# Patient Record
Sex: Female | Born: 1995 | Race: Black or African American | Hispanic: No | Marital: Single | Smoking: Never smoker
Health system: Southern US, Community
[De-identification: ages and names within clinical notes are randomized; demographics above are authoritative.]

## PROBLEM LIST (undated history)

## (undated) HISTORY — PX: MYRINGOPLASTY: SUR873

## (undated) HISTORY — PX: MYRINGOTOMY: SUR874

---
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
## 2017-02-02 ENCOUNTER — Encounter: Payer: Self-pay | Admitting: General Practice

## 2017-02-02 ENCOUNTER — Ambulatory Visit (INDEPENDENT_AMBULATORY_CARE_PROVIDER_SITE_OTHER): Payer: Medicaid Other | Admitting: *Deleted

## 2017-02-02 DIAGNOSIS — Z3042 Encounter for surveillance of injectable contraceptive: Secondary | ICD-10-CM | POA: Diagnosis present

## 2017-02-02 MED ORDER — MEDROXYPROGESTERONE ACETATE 150 MG/ML IM SUSP
150.0000 mg | Freq: Once | INTRAMUSCULAR | Status: AC
  Administered 2017-02-02: 150 mg via INTRAMUSCULAR

## 2017-04-27 ENCOUNTER — Ambulatory Visit (INDEPENDENT_AMBULATORY_CARE_PROVIDER_SITE_OTHER): Payer: Medicaid Other | Admitting: *Deleted

## 2017-04-27 VITALS — BP 99/58 | HR 60

## 2017-04-27 DIAGNOSIS — Z3042 Encounter for surveillance of injectable contraceptive: Secondary | ICD-10-CM | POA: Diagnosis not present

## 2017-04-27 MED ORDER — MEDROXYPROGESTERONE ACETATE 150 MG/ML IM SUSP
150.0000 mg | Freq: Once | INTRAMUSCULAR | Status: AC
  Administered 2017-04-27: 150 mg via INTRAMUSCULAR

## 2017-04-27 NOTE — Progress Notes (Signed)
Patient presents to clinic for depo provera injection. Tolerated well.

## 2017-07-12 ENCOUNTER — Telehealth: Payer: Self-pay | Admitting: General Practice

## 2017-07-12 NOTE — Telephone Encounter (Signed)
Patient called and left message stating she has a question and would like a callback. Called patient, no answer- left message stating we are trying to reach you to return your phone call, please call us back or send us a mychart message

## 2017-07-13 ENCOUNTER — Ambulatory Visit: Payer: Medicaid Other

## 2017-07-13 NOTE — Telephone Encounter (Signed)
Patient called and left another message stating she would like a call back as she has questions about medicaid

## 2017-07-17 ENCOUNTER — Telehealth: Payer: Self-pay | Admitting: *Deleted

## 2017-07-17 ENCOUNTER — Encounter: Payer: Self-pay | Admitting: Obstetrics and Gynecology

## 2017-07-17 NOTE — Telephone Encounter (Signed)
Patient has a question for a nurse. °

## 2017-07-26 NOTE — Telephone Encounter (Signed)
See previous note

## 2017-07-26 NOTE — Telephone Encounter (Signed)
Attempted to contact patient again via phone.  No answer.  Left message to return our call if she still has questions.

## 2017-07-31 ENCOUNTER — Ambulatory Visit (INDEPENDENT_AMBULATORY_CARE_PROVIDER_SITE_OTHER): Payer: Medicaid Other

## 2017-07-31 VITALS — BP 114/62 | HR 68

## 2017-07-31 DIAGNOSIS — Z3042 Encounter for surveillance of injectable contraceptive: Secondary | ICD-10-CM | POA: Diagnosis not present

## 2017-07-31 DIAGNOSIS — Z3049 Encounter for surveillance of other contraceptives: Secondary | ICD-10-CM | POA: Diagnosis not present

## 2017-07-31 LAB — POCT PREGNANCY, URINE: Preg Test, Ur: NEGATIVE

## 2017-07-31 MED ORDER — MEDROXYPROGESTERONE ACETATE 150 MG/ML IM SUSP
150.0000 mg | Freq: Once | INTRAMUSCULAR | Status: AC
  Administered 2017-07-31: 150 mg via INTRAMUSCULAR

## 2017-07-31 NOTE — Progress Notes (Signed)
Patient presented to the office for UPT and Depo injection. UPT negative.

## 2017-10-16 ENCOUNTER — Ambulatory Visit: Payer: Medicaid Other

## 2017-10-26 ENCOUNTER — Ambulatory Visit (INDEPENDENT_AMBULATORY_CARE_PROVIDER_SITE_OTHER): Payer: Medicaid Other | Admitting: *Deleted

## 2017-10-26 VITALS — BP 127/60 | HR 70

## 2017-10-26 DIAGNOSIS — Z3049 Encounter for surveillance of other contraceptives: Secondary | ICD-10-CM

## 2017-10-26 DIAGNOSIS — Z3042 Encounter for surveillance of injectable contraceptive: Secondary | ICD-10-CM | POA: Diagnosis present

## 2017-10-26 MED ORDER — MEDROXYPROGESTERONE ACETATE 150 MG/ML IM SUSP
150.0000 mg | Freq: Once | INTRAMUSCULAR | Status: AC
  Administered 2017-10-26: 150 mg via INTRAMUSCULAR

## 2017-10-26 NOTE — Progress Notes (Signed)
Depo Provera injection administered @ 1500 as scheduled. Next injection due 4/26-5/10.

## 2017-12-06 ENCOUNTER — Telehealth: Payer: Self-pay | Admitting: General Practice

## 2017-12-06 NOTE — Telephone Encounter (Signed)
Patient called and left message on nurse line stating she recently received a bill and she has a question about it. Called patient, no answer- left message on voicemail stating we are trying to reach you to return your phone call, please call us back or reach out to us via mychart. Will send message

## 2018-01-11 ENCOUNTER — Ambulatory Visit: Payer: Medicaid Other | Admitting: General Practice

## 2018-01-11 NOTE — Progress Notes (Signed)
erroneous encounter. Patient not seen

## 2018-01-15 ENCOUNTER — Ambulatory Visit (INDEPENDENT_AMBULATORY_CARE_PROVIDER_SITE_OTHER): Payer: Medicaid Other | Admitting: Advanced Practice Midwife

## 2018-01-15 ENCOUNTER — Encounter: Payer: Self-pay | Admitting: Advanced Practice Midwife

## 2018-01-15 DIAGNOSIS — Z01419 Encounter for gynecological examination (general) (routine) without abnormal findings: Secondary | ICD-10-CM

## 2018-01-15 DIAGNOSIS — Z3042 Encounter for surveillance of injectable contraceptive: Secondary | ICD-10-CM

## 2018-01-15 DIAGNOSIS — Z0000 Encounter for general adult medical examination without abnormal findings: Secondary | ICD-10-CM

## 2018-01-15 NOTE — Patient Instructions (Signed)

## 2018-01-15 NOTE — Progress Notes (Signed)
GYNECOLOGY ANNUAL PREVENTATIVE CARE ENCOUNTER NOTE  Subjective:   Erika Calhoun is a 22 y.o. No obstetric history on file. female here for a routine annual gynecologic exam.  Current complaints: none.   Denies abnormal vaginal bleeding, discharge, pelvic pain, problems with intercourse or other gynecologic concerns.    Gynecologic History No LMP recorded. Patient has had an injection. Patient has minimal spotting just before she is due for injection  Contraception: Depo-Provera injections Last Pap: NA. Results were: NA Last mammogram: NA. Results were: NA  Obstetric History OB History  Gravida Para Term Preterm AB Living  0 0 0 0 0 0  SAB TAB Ectopic Multiple Live Births  0 0 0 0 0    History reviewed. No pertinent past medical history.  Past Surgical History:  Procedure Laterality Date  . MYRINGOPLASTY    . MYRINGOTOMY      Current Outpatient Medications on File Prior to Visit  Medication Sig Dispense Refill  . medroxyPROGESTERone (DEPO-PROVERA) 150 MG/ML injection Inject 150 mg into the muscle every 3 (three) months.    . cetirizine (ZYRTEC ALLERGY) 10 MG tablet Take 1 tablet (10 mg total) by mouth daily. (Patient not taking: Reported on 04/27/2017) 30 tablet 0   No current facility-administered medications on file prior to visit.     Allergies  Allergen Reactions  . Chocolate Hives    Social History   Socioeconomic History  . Marital status: Single    Spouse name: Not on file  . Number of children: Not on file  . Years of education: Not on file  . Highest education level: Not on file  Occupational History  . Not on file  Social Needs  . Financial resource strain: Not on file  . Food insecurity:    Worry: Not on file    Inability: Not on file  . Transportation needs:    Medical: Not on file    Non-medical: Not on file  Tobacco Use  . Smoking status: Never Smoker  . Smokeless tobacco: Never Used  Substance and Sexual Activity  . Alcohol use: No  . Drug  use: No  . Sexual activity: Not Currently    Birth control/protection: Injection  Lifestyle  . Physical activity:    Days per week: Not on file    Minutes per session: Not on file  . Stress: Not on file  Relationships  . Social connections:    Talks on phone: Not on file    Gets together: Not on file    Attends religious service: Not on file    Active member of club or organization: Not on file    Attends meetings of clubs or organizations: Not on file    Relationship status: Not on file  . Intimate partner violence:    Fear of current or ex partner: Not on file    Emotionally abused: Not on file    Physically abused: Not on file    Forced sexual activity: Not on file  Other Topics Concern  . Not on file  Social History Narrative  . Not on file    Family History  Family history unknown: Yes    The following portions of the patient's history were reviewed and updated as appropriate: allergies, current medications, past family history, past medical history, past social history, past surgical history and problem list.  Review of Systems Pertinent items noted in HPI and remainder of comprehensive ROS otherwise negative.   Objective:  There were no vitals taken  for this visit. CONSTITUTIONAL: Well-developed, well-nourished female in no acute distress.  HENT:  Normocephalic, atraumatic, External right and left ear normal. Oropharynx is clear and moist EYES: Conjunctivae and EOM are normal. Pupils are equal, round, and reactive to light. No scleral icterus.  NECK: Normal range of motion, supple, no masses.  Normal thyroid.  SKIN: Skin is warm and dry. No rash noted. Not diaphoretic. No erythema. No pallor. NEUROLOGIC: Alert and oriented to person, place, and time. Normal reflexes, muscle tone coordination. No cranial nerve deficit noted. PSYCHIATRIC: Normal mood and affect. Normal behavior. Normal judgment and thought content. CARDIOVASCULAR: Normal heart rate noted, regular  rhythm RESPIRATORY: Clear to auscultation bilaterally. Effort and breath sounds normal, no problems with respiration noted. BREASTS: Symmetric in size. No masses, skin changes, nipple drainage, or lymphadenopathy. MUSCULOSKELETAL: Normal range of motion. No tenderness.  No cyanosis, clubbing, or edema.     Assessment and Plan:  1. Visit for preventive health examination 2. Well woman exam 3. Encounter for surveillance of injectable contraceptive   No pap today 2/2 age Continue depo  Routine preventative health maintenance measures emphasized. Please refer to After Visit Summary for other counseling recommendations.

## 2018-03-09 ENCOUNTER — Other Ambulatory Visit: Payer: Self-pay

## 2018-03-09 ENCOUNTER — Emergency Department (HOSPITAL_COMMUNITY)
Admission: EM | Admit: 2018-03-09 | Discharge: 2018-03-09 | Disposition: A | Discharge status: Home / Self Care | Payer: Medicaid Other | Attending: Emergency Medicine | Admitting: Emergency Medicine

## 2018-03-09 ENCOUNTER — Encounter (HOSPITAL_COMMUNITY): Payer: Self-pay | Admitting: Emergency Medicine

## 2018-03-09 DIAGNOSIS — R202 Paresthesia of skin: Secondary | ICD-10-CM | POA: Insufficient documentation

## 2018-03-09 DIAGNOSIS — M25532 Pain in left wrist: Secondary | ICD-10-CM

## 2018-03-09 DIAGNOSIS — R200 Anesthesia of skin: Secondary | ICD-10-CM | POA: Insufficient documentation

## 2018-03-09 MED ORDER — NAPROXEN 500 MG PO TABS: 500.0000 mg | ORAL_TABLET | Freq: Two times a day (BID) | ORAL | 0 refills | Status: AC

## 2018-03-09 NOTE — Discharge Instructions (Signed)
Take naproxen 2 times a day with meals.  Do not take other anti-inflammatories at the same time open (Advil, Motrin, ibuprofen, Aleve). You may supplement with Tylenol if you need further pain control. Use ice packs to help with pain and swelling.  Wear the wrist brace whenever able.  Follow up with [facility] or the hand doctor if your pain is not improving with this treatment after a week.  Return to the ER if you develop chest pain, shortness of breath, fevers, redness/warmth, inability to move your shoulder/elbow/wrist, or with any new or concerning symptoms.

## 2018-03-09 NOTE — ED Triage Notes (Signed)
Pt arriving with left hand pain. Pt states she had gotten her nails done a few days ago then noticed swelling in her left hand and pain that extends up to her elbow. Pt reports the swelling has not improved over the last couple days.

## 2018-03-09 NOTE — ED Provider Notes (Signed)
[facility]-EMERGENCY DEPT Provider Note   CSN: [id number] Arrival date & time: 03/09/18  2017     History   Chief Complaint Chief Complaint  Patient presents with  . Hand Pain    HPI Erika Calhoun is a 22 y.o. female presenting for evaluation of left hand/arm pain.  Patient states over the past few days, she has had worsening left hand and arm pain.  She reports associated numbness/tingling of the thumb, index, and middle fingers.  Pain is worse with certain movements.  She states she works 2 jobs, both in which she uses her hands frequently.  She denies fall, trauma, or injury.  She had her nails done several days ago, but denies pain that began at the nail salon.  She denies redness or warmth of her hand or arm.  She has taken 1 dose of 200 mg of Motrin without improvement of her symptoms.  Nothing makes her pain better.  She denies history of similar.  no radiation of the pain. She denies symptoms on the other side.  She has a history of PCOS for which she gets depo-provera shots, no other medical problems.  HPI  History reviewed. No pertinent past medical history.  Patient Active Problem List   Diagnosis Date Noted  . Visit for preventive health examination 11/17/2016    Past Surgical History:  Procedure Laterality Date  . MYRINGOPLASTY    . MYRINGOTOMY       OB History    Gravida  0   Para  0   Term  0   Preterm  0   AB  0   Living  0     SAB  0   TAB  0   Ectopic  0   Multiple  0   Live Births  0            Home Medications    Prior to Admission medications   Medication Sig Start Date End Date Taking? Authorizing Provider  cetirizine (ZYRTEC ALLERGY) 10 MG tablet Take 1 tablet (10 mg total) by mouth daily. Patient not taking: Reported on 04/27/2017 11/18/15   Everlene Farrier, PA-C  medroxyPROGESTERone (DEPO-PROVERA) 150 MG/ML injection Inject 150 mg into the muscle every 3 (three) months.    [provider]    naproxen (NAPROSYN) 500 MG tablet Take 1 tablet (500 mg total) by mouth 2 (two) times daily with a meal. 03/09/18   Lasheena Frieze, PA-C    Family History Family History  Family history unknown: Yes    Social History Social History   Tobacco Use  . Smoking status: Never Smoker  . Smokeless tobacco: Never Used  Substance Use Topics  . Alcohol use: No  . Drug use: No     Allergies   Chocolate   Review of Systems Review of Systems  Musculoskeletal: Positive for arthralgias and myalgias.  Neurological: Positive for numbness.  Hematological: Does not bruise/bleed easily.     Physical Exam Updated Vital Signs BP 133/75 (BP Location: Right Arm)   Pulse 73   Temp 98.5 F (36.9 C) (Oral)   Resp 18   Ht 4\' 9"  (1.448 m)   Wt 84.8 kg (187 lb)   SpO2 100%   BMI 40.47 kg/m   Physical Exam  Constitutional: She is oriented to person, place, and time. She appears well-developed and well-nourished. No distress.  HENT:  Head: Normocephalic and atraumatic.  Eyes: EOM are normal.  Neck: Normal range of motion.  Pulmonary/Chest: Effort normal.  Abdominal: She exhibits no distension.  Musculoskeletal: Normal range of motion. She exhibits edema and tenderness. She exhibits no deformity.  Mild swelling noted of the left hand.  No erythema or warmth.  Tenderness palpation along the medial nerve.  Negative Tinel's, positive Phalen's.  Full active range of motion of the shoulder, elbow, wrist, and fingers without difficulty.  Grip strength intact bilaterally.  Sensation intact bilaterally.  Good cap refill.  No obvious deformity.  No symptoms or abnormalities noted on the right side.  Neurological: She is alert and oriented to person, place, and time. A sensory deficit is present.  Patient reports decreased sensation of thumb, first, and second finger, but reports pain with squeezing fingers.  Doubt true numbness.  Skin: Skin is warm. Capillary refill takes less than 2 seconds. No  rash noted.  Psychiatric: She has a normal mood and affect.  Nursing note and vitals reviewed.    ED Treatments / Results  Labs (all labs ordered are listed, but only abnormal results are displayed) Labs Reviewed - No data to display  EKG None  Radiology No results found.  Procedures Procedures (including critical care time)  Medications Ordered in ED Medications - No data to display   Initial Impression / Assessment and Plan / ED Course  I have reviewed the triage vital signs and the nursing notes.  Pertinent labs & imaging results that were available during my care of the patient were reviewed by me and considered in my medical decision making (see chart for details).     Patient presenting for evaluation of left arm/hand pain.  Physical exam consistent with carpal tunnel syndrome.  No erythema or warmth, doubt infection. No arm swelling, doubt upper extremity dvt.  No trauma or injury, I do not believe x-rays would be beneficial at this time.  Discussed findings with patient.  Discussed treatment with NSAIDs and wrist splint.  Discussed follow-up with [place] and wellness or hand dr for further management.  At this time, patient appears safe for discharge.  Return precautions given.  Patient states she understands and agrees plan.  Final Clinical Impressions(s) / ED Diagnoses   Final diagnoses:  Left wrist pain    ED Discharge Orders        Ordered    naproxen (NAPROSYN) 500 MG tablet  2 times daily with meals     03/09/18 2127       Alveria ApleyCaccavale, Asuna Peth, PA-C 03/09/18 2137    Tilden Fossaees, Elizabeth, MD 03/10/18 1435

## 2018-04-04 ENCOUNTER — Ambulatory Visit: Payer: Medicaid Other
